# Patient Record
Sex: Male | Born: 1960 | Hispanic: No | Marital: Married | Smoking: Former smoker
Health system: Southern US, Community
[De-identification: ages and names within clinical notes are randomized; demographics above are authoritative.]

## PROBLEM LIST (undated history)

## (undated) DIAGNOSIS — M109 Gout, unspecified: Secondary | ICD-10-CM

## (undated) DIAGNOSIS — I447 Left bundle-branch block, unspecified: Secondary | ICD-10-CM

## (undated) HISTORY — DX: Gout, unspecified: M10.9

---

## 2009-12-16 ENCOUNTER — Ambulatory Visit (HOSPITAL_COMMUNITY): Admission: RE | Admit: 2009-12-16 | Discharge: 2009-12-16 | Payer: Self-pay | Admitting: Orthopedic Surgery

## 2014-04-29 ENCOUNTER — Emergency Department (HOSPITAL_BASED_OUTPATIENT_CLINIC_OR_DEPARTMENT_OTHER)
Admission: EM | Admit: 2014-04-29 | Discharge: 2014-04-29 | Disposition: A | Discharge status: Home / Self Care | Payer: 59 | Attending: Emergency Medicine | Admitting: Emergency Medicine

## 2014-04-29 ENCOUNTER — Encounter (HOSPITAL_BASED_OUTPATIENT_CLINIC_OR_DEPARTMENT_OTHER): Payer: Self-pay

## 2014-04-29 ENCOUNTER — Emergency Department (HOSPITAL_BASED_OUTPATIENT_CLINIC_OR_DEPARTMENT_OTHER): Payer: 59

## 2014-04-29 DIAGNOSIS — J159 Unspecified bacterial pneumonia: Secondary | ICD-10-CM | POA: Insufficient documentation

## 2014-04-29 DIAGNOSIS — Z880 Allergy status to penicillin: Secondary | ICD-10-CM | POA: Diagnosis not present

## 2014-04-29 DIAGNOSIS — R079 Chest pain, unspecified: Secondary | ICD-10-CM

## 2014-04-29 DIAGNOSIS — R51 Headache: Secondary | ICD-10-CM | POA: Insufficient documentation

## 2014-04-29 DIAGNOSIS — J189 Pneumonia, unspecified organism: Secondary | ICD-10-CM

## 2014-04-29 DIAGNOSIS — R531 Weakness: Secondary | ICD-10-CM | POA: Insufficient documentation

## 2014-04-29 DIAGNOSIS — R109 Unspecified abdominal pain: Secondary | ICD-10-CM | POA: Diagnosis present

## 2014-04-29 DIAGNOSIS — R42 Dizziness and giddiness: Secondary | ICD-10-CM | POA: Insufficient documentation

## 2014-04-29 DIAGNOSIS — M791 Myalgia: Secondary | ICD-10-CM | POA: Insufficient documentation

## 2014-04-29 HISTORY — DX: Left bundle-branch block, unspecified: I44.7

## 2014-04-29 LAB — COMPREHENSIVE METABOLIC PANEL
ALBUMIN: 3.5 g/dL (ref 3.5–5.2)
ALK PHOS: 76 U/L (ref 39–117)
ALT: 24 U/L (ref 0–53)
ANION GAP: 17 — AB (ref 5–15)
AST: 28 U/L (ref 0–37)
BUN: 10 mg/dL (ref 6–23)
CHLORIDE: 98 meq/L (ref 96–112)
CO2: 22 mEq/L (ref 19–32)
Calcium: 8.9 mg/dL (ref 8.4–10.5)
Creatinine, Ser: 1.1 mg/dL (ref 0.50–1.35)
GFR calc Af Amer: 87 mL/min — ABNORMAL LOW (ref >90)
GFR calc non Af Amer: 75 mL/min — ABNORMAL LOW (ref >90)
Glucose, Bld: 123 mg/dL — ABNORMAL HIGH (ref 70–99)
POTASSIUM: 3.9 meq/L (ref 3.7–5.3)
Sodium: 137 mEq/L (ref 137–147)
TOTAL PROTEIN: 8.2 g/dL (ref 6.0–8.3)
Total Bilirubin: 0.5 mg/dL (ref 0.3–1.2)

## 2014-04-29 LAB — TROPONIN I: Troponin I: <0.3 ng/mL (ref <0.30)

## 2014-04-29 LAB — I-STAT CG4 LACTIC ACID, ED: LACTIC ACID, VENOUS: 1.01 mmol/L (ref 0.5–2.2)

## 2014-04-29 LAB — CBC WITH DIFFERENTIAL/PLATELET
BASOS ABS: 0 10*3/uL (ref 0.0–0.1)
BASOS PCT: 0 % (ref 0–1)
EOS ABS: 0 10*3/uL (ref 0.0–0.7)
Eosinophils Relative: 0 % (ref 0–5)
HCT: 46.9 % (ref 39.0–52.0)
HEMOGLOBIN: 16.1 g/dL (ref 13.0–17.0)
Lymphocytes Relative: 12 % (ref 12–46)
Lymphs Abs: 1.6 10*3/uL (ref 0.7–4.0)
MCH: 28.6 pg (ref 26.0–34.0)
MCHC: 34.3 g/dL (ref 30.0–36.0)
MCV: 83.3 fL (ref 78.0–100.0)
MONOS PCT: 10 % (ref 3–12)
Monocytes Absolute: 1.4 10*3/uL — ABNORMAL HIGH (ref 0.1–1.0)
NEUTROS ABS: 10.5 10*3/uL — AB (ref 1.7–7.7)
NEUTROS PCT: 78 % — AB (ref 43–77)
PLATELETS: 205 10*3/uL (ref 150–400)
RBC: 5.63 MIL/uL (ref 4.22–5.81)
RDW: 12.7 % (ref 11.5–15.5)
WBC: 13.6 10*3/uL — ABNORMAL HIGH (ref 4.0–10.5)

## 2014-04-29 LAB — LIPASE, BLOOD: LIPASE: 21 U/L (ref 11–59)

## 2014-04-29 MED ORDER — CEFTRIAXONE SODIUM 1 G IJ SOLR
INTRAMUSCULAR | Status: AC
  Filled 2014-04-29: qty 10

## 2014-04-29 MED ORDER — SODIUM CHLORIDE 0.9 % IV BOLUS (SEPSIS)
2000.0000 mL | Freq: Once | INTRAVENOUS | Status: AC
  Administered 2014-04-29: 2000 mL via INTRAVENOUS

## 2014-04-29 MED ORDER — AZITHROMYCIN 250 MG PO TABS: ORAL_TABLET | ORAL | Status: DC

## 2014-04-29 MED ORDER — ACETAMINOPHEN 500 MG PO TABS
1000.0000 mg | ORAL_TABLET | Freq: Once | ORAL | Status: AC
  Administered 2014-04-29: 1000 mg via ORAL
  Filled 2014-04-29: qty 2

## 2014-04-29 MED ORDER — AZITHROMYCIN 250 MG PO TABS
500.0000 mg | ORAL_TABLET | Freq: Once | ORAL | Status: AC
  Administered 2014-04-29: 500 mg via ORAL
  Filled 2014-04-29: qty 2

## 2014-04-29 MED ORDER — DEXTROSE 5 % IV SOLN
1.0000 g | Freq: Once | INTRAVENOUS | Status: AC
  Administered 2014-04-29: 1 g via INTRAVENOUS

## 2014-04-29 NOTE — Discharge Instructions (Signed)
Pneumonia Take the antibiotic as prescribed starting tomorrow. Make sure that you stay well hydrated. Drink six 8 ounce glasses of water or Gatorade daily. Take Tylenol as directed every 4 hours for temperature higher than 100.4 while awake Call any of the numbers on the resource guide to get a primary care physician. Return if you feel worse for any reason Pneumonia is an infection of the lungs.  CAUSES Pneumonia may be caused by bacteria or a virus. Usually, these infections are caused by breathing infectious particles into the lungs (respiratory tract). SIGNS AND SYMPTOMS   Cough.  Fever.  Chest pain.  Increased rate of breathing.  Wheezing.  Mucus production. DIAGNOSIS  If you have the common symptoms of pneumonia, your health care provider will typically confirm the diagnosis with a chest X-ray. The X-ray will show an abnormality in the lung (pulmonary infiltrate) if you have pneumonia. Other tests of your blood, urine, or sputum may be done to find the specific cause of your pneumonia. Your health care provider may also do tests (blood gases or pulse oximetry) to see how well your lungs are working. TREATMENT  Some forms of pneumonia may be spread to other people when you cough or sneeze. You may be asked to wear a mask before and during your exam. Pneumonia that is caused by bacteria is treated with antibiotic medicine. Pneumonia that is caused by the influenza virus may be treated with an antiviral medicine. Most other viral infections must run their course. These infections will not respond to antibiotics.  HOME CARE INSTRUCTIONS   Cough suppressants may be used if you are losing too much rest. However, coughing protects you by clearing your lungs. You should avoid using cough suppressants if you can.  Your health care provider may have prescribed medicine if he or she thinks your pneumonia is caused by bacteria or influenza. Finish your medicine even if you start to feel  better.  Your health care provider may also prescribe an expectorant. This loosens the mucus to be coughed up.  Take medicines only as directed by your health care provider.  Do not smoke. Smoking is a common cause of bronchitis and can contribute to pneumonia. If you are a smoker and continue to smoke, your cough may last several weeks after your pneumonia has cleared.  A cold steam vaporizer or humidifier in your room or home may help loosen mucus.  Coughing is often worse at night. Sleeping in a semi-upright position in a recliner or using a couple pillows under your head will help with this.  Get rest as you feel it is needed. Your body will usually let you know when you need to rest. PREVENTION A pneumococcal shot (vaccine) is available to prevent a common bacterial cause of pneumonia. This is usually suggested for:  People over 53 years old.  Patients on chemotherapy.  People with chronic lung problems, such as bronchitis or emphysema.  People with immune system problems. If you are over 65 or have a high risk condition, you may receive the pneumococcal vaccine if you have not received it before. In some countries, a routine influenza vaccine is also recommended. This vaccine can help prevent some cases of pneumonia.You may be offered the influenza vaccine as part of your care. If you smoke, it is time to quit. You may receive instructions on how to stop smoking. Your health care provider can provide medicines and counseling to help you quit. SEEK MEDICAL CARE IF: You have a fever.  SEEK IMMEDIATE MEDICAL CARE IF:   Your illness becomes worse. This is especially true if you are elderly or weakened from any other disease.  You cannot control your cough with suppressants and are losing sleep.  You begin coughing up blood.  You develop pain which is getting worse or is uncontrolled with medicines.  Any of the symptoms which initially brought you in for treatment are getting  worse rather than better.  You develop shortness of breath or chest pain. MAKE SURE YOU:   Understand these instructions.  Will watch your condition.  Will get help right away if you are not doing well or get worse. Document Released: 05/03/2005 Document Revised: 09/17/2013 Document Reviewed: 07/23/2010 [facility] Patient Information 2015 [employer], [place]. This information is not intended to replace advice given to you by your health care provider. Make sure you discuss any questions you have with your health care provider.  Emergency Department Resource Guide 1) Find a Doctor and Pay Out of Pocket Although you won't have to find out who is covered by your insurance plan, it is a good idea to ask around and get recommendations. You will then need to call the office and see if the doctor you have chosen will accept you as a new patient and what types of options they offer for patients who are self-pay. Some doctors offer discounts or will set up payment plans for their patients who do not have insurance, but you will need to ask so you aren't surprised when you get to your appointment.  2) Contact Your Local Health Department Not all health departments have doctors that can see patients for sick visits, but many do, so it is worth a call to see if yours does. If you don't know where your local health department is, you can check in your phone book. The CDC also has a tool to help you locate your state's health department, and many state websites also have listings of all of their local health departments.  3) Find a Walk-in Clinic If your illness is not likely to be very severe or complicated, you may want to try a walk in clinic. These are popping up all over the country in pharmacies, drugstores, and shopping centers. They're usually staffed by nurse practitioners or physician assistants that have been trained to treat common illnesses and complaints. They're usually fairly quick and inexpensive.  However, if you have serious medical issues or chronic medical problems, these are probably not your best option.  No Primary Care Doctor: - Call Health Connect at  [phone] - they can help you locate a primary care doctor that  accepts your insurance, provides certain services, etc. - Physician Referral Service- [phone]  Chronic Pain Problems: Organization         Address  Phone   Notes  [facility]  [phone] Patients need to be referred by their primary care doctor.   Medication Assistance: Organization         Address  Phone   Notes  [facility] Medication [facility] [address] [place], [place] [phone] --Must be a resident of [place] [facility] -- Must have NO insurance coverage whatsoever (no Medicaid/ Medicare, etc.) -- The pt. MUST have a primary care doctor that directs their care regularly and follows them in the community   MedAssist  [phone]   [employer]  [phone]    Agencies that provide inexpensive medical care: Organization  Address  Phone   Notes  [facility]  [phone]   [facility]    [phone]   [facility] [address] [place], [place] [address] [phone]   [facility] [address] [place] [place] [phone]   Planned Parenthood    [phone]   [facility]    [phone]   [facility]  [address], [place] Phone:  [phone], Fax:  [phone] Hours of Operation:  9 am - 6 pm, M-F.  Also accepts Medicaid/Medicare and self-pay.  [facility]  [address], [place] Phone: [phone], Fax: [phone]. Hours of Operation:  8:30 am - 5:30 pm, M-F.  Also accepts Medicaid and self-pay.  [facility] [place] [address], [place] Phone: [phone]   [facility] [address] Natasha Bence [place], [place] [phone], Ext. [phone] Mondays & Thursdays: 7-9 AM.  First 15 patients are seen on a first come, first serve basis.    Medicaid-accepting [facility] Providers:  Organization         Address  Phone   Notes  [facility] [address], [place] [phone] Also accepts self-pay patients.  [facility] [address] Laurell Josephs [place], [place]  [phone]   [facility] [address], [place] [phone]   [facility] [address], [place] [phone]   Renaye Rakers [address], [place]   [phone] Only accepts [place] patients after they have their name applied to their card.   Self-Pay (no insurance) in [facility]:  Organization         Address  Phone   Notes  Sickle Cell Patients, [facility] [address] [place], [place] [phone]   [facility] [address] [place], [place] [phone]   [facility] [place]  [address], [place] [phone]   [facility]/Dr. Osei-Bonsu  [address] [place] or [address], [place] [phone] Phone number for both [place] and [place] locations is the same.  [facility] and [facility] [address], [place] [phone]   [facility] [address], [place] or [address] [phone]   [facility] [address] [place] [phone], phone; [phone], fax Sees patients 1st and 3rd Saturday of every month.  Must not qualify for public or private insurance (i.e. Medicaid, Medicare, [place] Health Choice, Veterans' Benefits)  Household income should be no more than 200% of the poverty level The clinic cannot treat you if you are pregnant or think  you are pregnant  Sexually transmitted diseases are not treated at the clinic.    Dental Care: Organization         Address  Phone  Notes  [facility] [address] [place], [place] [phone] Accepts children up to age 81 who are enrolled in [place] or [place] Health Choice; pregnant women with a Medicaid card; and children who have applied for Medicaid or [place] Health Choice, but were declined, whose parents can pay a reduced fee at time  of service.  [facility] [place]  [address], [place] [phone] Accepts children up to age 521 who are enrolled in [place] or [place] Health Choice; pregnant women with a Medicaid card; and children who have applied for Medicaid or [place] Health Choice, but were declined, whose parents can pay a reduced fee at time of service.  [facility]  [address], [place] [phone] Patients are seen by appointment only. Walk-ins are not accepted. [facility] will see patients 53 years of age and older. Monday - Tuesday (8am-5pm) Most Wednesdays (8:30-5pm) $30 per visit, cash only  [facility]  [address], [facility] [phone] Patients are seen by appointment only. Walk-ins are not accepted. [facility] will see patients 53 years of age and older. One Wednesday Evening (Monthly: Volunteer Based).  $30 per visit, cash only  [employer] [facility] [facility]  [phone] for adults; Children under age 424, call Graduate Pediatric Dentistry at [phone]) [phone]. Children aged 304-14, please call [phone] to request a pediatric application.  Dental services are provided in all areas of dental care including fillings, crowns and bridges, complete and partial dentures, implants, gum treatment, root canals, and extractions. Preventive care is also provided. Treatment is provided to both adults and  children. Patients are selected via a lottery and there is often a waiting list.   [facility] [address], [place]  [phone]) [phone] [email or web address]   [facility] [address], [place], [place] [phone], Ext. [phone] Second and Fourth Thursday of each month, opens at 6:30 AM; Clinic ends at 9 AM.  Patients are seen on a first-come first-served basis, and a limited number are seen during each clinic.   [facility]  [address], [place], [place] [phone]) [phone]   Eligibility Requirements You must have lived in [place], [place], or [place] counties for at least the last three months.   You cannot be eligible for state or federal sponsored National Cityhealthcare insurance, including CIGNAVeterans Administration, [place]icaid, or [employer] EntertainmentMedicare.   You generally cannot be eligible for healthcare insurance through your employer.    How to apply: Eligibility screenings are held every Tuesday and Wednesday afternoon from 1:00 pm until 4:00 pm. You do not need an appointment for the interview!  [facility] [address], [place], [place] [phone]   [facility]  [phone]   [facility]  [phone]   [facility]  [phone]    Behavioral Health Resources in the Community: Intensive Outpatient Programs Organization         Address  Phone  Notes  [facility] [address], [place], [place] [phone]   [facility] Outpatient [address], [place], [place] [phone]   [facility] [address], [place], [place]  [phone]   [facility] [address],  [place], [place] [phone] or [phone]   Substance Abuse Resources Organization         Address  Phone  Notes  [facility]  [phone]   [facility]  [phone]   [facility]  [phone]     [facility]  [phone]   Residential & Outpatient Substance Abuse Program  [phone]   Psychological Services Organization         Address  Phone  Notes  [facility]  [phone]- [phone]   [facility]  [phone]- [phone]   [facility] [address], [place] [phone] or [phone]    Mobile Crisis Teams Organization         Address  Phone  Notes  [facility], Mobile Crisis Care Unit  [phone]   [facility]  [address] [place], [place] [phone]   Doristine LocksSharon DeEsch [address] [place] [phone]    Self-Help/Support Groups Organization         Address  Phone             Notes  [facility] [place] - variety of support groups  [phone] Call for more information  Narcotics Anonymous (NA), [facility]aring [facility] [address], [place]  2 meetings at this location   Statisticianesidential Treatment Programs Organization         Address  Phone  Notes  [facility] [address],    [place]  [phone]   [facility]  [address], [place] [phone], [place], [place] [phone]   [facility] [address], [place] [phone] Admissions: 8am-3pm M-F  [facility] [address]    [place], [place] [phone]   [facility] [address], [place], [place] [phone]   [facility] [address]  [place], [place] [phone]   [facility] - Intensive Outpatient [address], [place], [place] [phone]   [facility] ([facility] [address]  [place], [place] [phone] or [phone]   [facility] ([facility]) [address] [place], [place] [phone] Accepts Medicaid  [facility] [address]  [place] [phone] Substance Abuse/Addiction Treatment   [facility]  Behavioral Health Resources Organization         Address  Phone  Notes  [facility]  [phone]) [phone]   Angie FavaJulie Brannon, PhD [address], [address] [place], [place]   [phone](336) [phone] or [phone](336) [phone]   [facility]   [address] [place], [place] [phone]) [phone]   [facility] [address], [place], [place] [phone]) [phone] Insurance/Medicaid/sponsorship through [facility]  [facility] Families [address]                                    [place], [place] [phone] Therapy/tele-psych/case  [facility] [address]   [place], [place] [phone]) [phone]    Dr. Lolly MustacheArfeen  [phone]   [facility]  [employer] [facility] 1) [address], [place] 2) [address], [place] 3)  [address], [place] [phone](336) [phone]  [phone]   [facility] Child Abuse Hotline [phone]) [phone] or [phone] (After Hours)

## 2014-04-29 NOTE — ED Notes (Signed)
Pt reports thought he had the flu went to doctor and was told his WBC were really high and they can't determine why he has the abdominal pain/diarrhea and loss of appetite.  Pt reports was sent for CT.

## 2014-04-29 NOTE — ED Provider Notes (Signed)
CSN: [id number]     Arrival date & time 04/29/14  1903 History  This chart was scribed for Doug Sou, MD by Abel Presto, ED Scribe. This patient was seen in room MH04/MH04 and the patient's care was started at 7:48 PM.    Chief Complaint  Patient presents with  . Abdominal Pain     Patient is a 53 y.o. male presenting with abdominal pain. The history is provided by the patient. No language interpreter was used.  Abdominal Pain Associated symptoms: chest pain (with cough), cough, diarrhea, fever and sore throat   Associated symptoms: no vomiting     HPI Comments: Carl Gray is a 53 y.o. male who presents to the Emergency Department complaining of cough, headache, myalgias onset 3 days ago.  Pt states he was working in the presence of mildew and upon returning home he noticed a "tickle" in his throat which developed into a productive cough with white sputum.  He notes associated chest pain with cough, sore throat, light headedness and weakness, myalgias, wheezing, headache, pressure in his eyes, fever of 102 today, and diarrhea around 5 times a day. He also reports slight sharp left-sided mid abdominal pain for the past 3 days. No pain at present. No vomiting no other associated symptoms Pt notes he has not been hungry the last two days, and has only eaten a muffin today. He notes he has been staying hydrated. Pt denies blood in stool, vomiting, recent use of Abx, prolonged travel, and any tenderness to palpation. Pt has been taking Alka Seltzer Flu Plus, Nyquil, and Tylenol (last dose at 10 AM) for relief.  Pt went to work yesterday with a reduced fever and feeling better but it was not long before he began to feel bad again.  Saw an urgent care center today. Leukocytosis noted on laboratory exam, referred here for further evaluation. Pt has PMHx of LBBB, but denies any other health problems. He denies smoking and drug use but notes occasional EtOH use. He has no PCP  Past Medical  History  Diagnosis Date  . LBBB (left bundle branch block)    History reviewed. No pertinent past surgical history. No family history on file. History  Substance Use Topics  . Smoking status: Never Smoker   . Smokeless tobacco: Not on file  . Alcohol Use: Yes    Review of Systems  Constitutional: Positive for fever and appetite change.  HENT: Positive for sore throat.   Eyes: Positive for pain.  Respiratory: Positive for cough and wheezing.   Cardiovascular: Positive for chest pain (with cough).       Chest pain with cough  Gastrointestinal: Positive for abdominal pain and diarrhea. Negative for vomiting and blood in stool.  Musculoskeletal: Positive for myalgias.  Skin: Negative.   Neurological: Positive for weakness, light-headedness and headaches.  Psychiatric/Behavioral: Negative.   All other systems reviewed and are negative.     Allergies  Penicillins  Home Medications   Prior to Admission medications   Not on File   BP 133/83 mmHg  Pulse 118  Temp(Src) 100.2 F (37.9 C) (Oral)  Resp 18  Ht 5\' 5"  (1.651 m)  Wt 190 lb (86.183 kg)  BMI 31.62 kg/m2 Physical Exam  Constitutional: He appears well-developed and well-nourished. No distress.  HENT:  Head: Normocephalic and atraumatic.  Eyes: Conjunctivae are normal. Pupils are equal, round, and reactive to light.  Neck: Neck supple. No tracheal deviation present. No thyromegaly present.  Cardiovascular: Normal rate and regular  rhythm.   No murmur heard. Pulmonary/Chest: Effort normal and breath sounds normal. No respiratory distress.  Scant diffuse rhonchi  Abdominal: Soft. Bowel sounds are normal. He exhibits no distension. There is no tenderness.  Genitourinary: Penis normal.  Normal male genitalia  Musculoskeletal: Normal range of motion. He exhibits no edema or tenderness.  Neurological: He is alert. Coordination normal.  Skin: Skin is warm and dry. No rash noted.  Psychiatric: He has a normal mood and  affect.  Nursing note and vitals reviewed.   ED Course  Procedures (including critical care time) DIAGNOSTIC STUDIES: Oxygen Saturation is 95% on room air, normal by my interpretation.    COORDINATION OF CARE: 7:58 PM Discussed treatment plan with patient at beside, the patient agrees with the plan and has no further questions at this time.   Labs Review Labs Reviewed  CBC WITH DIFFERENTIAL  COMPREHENSIVE METABOLIC PANEL  LIPASE, BLOOD  I-STAT TROPOININ, ED    Imaging Review No results found.   EKG Interpretation None     chest x-ray viewed by me Results for orders placed or performed during the hospital encounter of 04/29/14  CBC with Differential  Result Value Ref Range   WBC 13.6 (H) 4.0 - 10.5 K/uL   RBC 5.63 4.22 - 5.81 MIL/uL   Hemoglobin 16.1 13.0 - 17.0 g/dL   HCT 16.146.9 09.639.0 - 04.552.0 %   MCV 83.3 78.0 - 100.0 fL   MCH 28.6 26.0 - 34.0 pg   MCHC 34.3 30.0 - 36.0 g/dL   RDW 40.912.7 81.111.5 - 91.415.5 %   Platelets 205 150 - 400 K/uL   Neutrophils Relative % 78 (H) 43 - 77 %   Neutro Abs 10.5 (H) 1.7 - 7.7 K/uL   Lymphocytes Relative 12 12 - 46 %   Lymphs Abs 1.6 0.7 - 4.0 K/uL   Monocytes Relative 10 3 - 12 %   Monocytes Absolute 1.4 (H) 0.1 - 1.0 K/uL   Eosinophils Relative 0 0 - 5 %   Eosinophils Absolute 0.0 0.0 - 0.7 K/uL   Basophils Relative 0 0 - 1 %   Basophils Absolute 0.0 0.0 - 0.1 K/uL  Comprehensive metabolic panel  Result Value Ref Range   Sodium 137 137 - 147 mEq/L   Potassium 3.9 3.7 - 5.3 mEq/L   Chloride 98 96 - 112 mEq/L   CO2 22 19 - 32 mEq/L   Glucose, Bld 123 (H) 70 - 99 mg/dL   BUN 10 6 - 23 mg/dL   Creatinine, Ser 7.821.10 0.50 - 1.35 mg/dL   Calcium 8.9 8.4 - 95.610.5 mg/dL   Total Protein 8.2 6.0 - 8.3 g/dL   Albumin 3.5 3.5 - 5.2 g/dL   AST 28 0 - 37 U/L   ALT 24 0 - 53 U/L   Alkaline Phosphatase 76 39 - 117 U/L   Total Bilirubin 0.5 0.3 - 1.2 mg/dL   GFR calc non Af Amer 75 (L) >90 mL/min   GFR calc Af Amer 87 (L) >90 mL/min   Anion gap 17  (H) 5 - 15  Lipase, blood  Result Value Ref Range   Lipase 21 11 - 59 U/L  Troponin I  Result Value Ref Range   Troponin I <0.30 <0.30 ng/mL  I-Stat CG4 Lactic Acid, ED  Result Value Ref Range   Lactic Acid, Venous 1.01 0.5 - 2.2 mmol/L   Dg Chest 2 View  04/29/2014   CLINICAL DATA:  Central chest pain. Cough. Shortness of  breath. Diarrhea.  EXAM: CHEST  2 VIEW  COMPARISON:  None.  FINDINGS: Airway thickening is present, suggesting bronchitis or reactive airways disease. Faint confluent linear opacities in the left lower lobe may reflect early bronchopneumonia.  Cardiac and mediastinal margins appear normal.  With  IMPRESSION: 1. Bilateral mild airway thickening, but with some confluent linear opacities in the retrocardiac position potentially reflecting early bronchopneumonia.   Electronically Signed   By: Herbie BaltimoreWalt  Liebkemann M.D.   On: 04/29/2014 20:15    10:15 PM patient feels improved after treatment with intravenous antibiotics and intravenous fluids MDM   Pneumonia severity score =53, suitable for outpatient therapyPlan prescription Zithromax. Referral resource guide to get primary care physician Final diagnoses:  Chest pain   Diagnosis community-acquired pneumonia       Doug SouSam Audery Wassenaar, MD 04/29/14 2221

## 2014-04-29 NOTE — ED Notes (Signed)
Alert, NAD, calm, interactive, resps e/u, speaking in clear complete sentences, VSS, (denies: pain, sob, nausea or dizziness), no dyspnea noted, denies questions. [facility]

## 2015-08-08 ENCOUNTER — Encounter: Payer: Self-pay | Admitting: Family Medicine

## 2015-09-03 ENCOUNTER — Encounter: Payer: Self-pay | Admitting: Gastroenterology

## 2015-10-24 ENCOUNTER — Ambulatory Visit (AMBULATORY_SURGERY_CENTER): Payer: Self-pay | Admitting: *Deleted

## 2015-10-24 VITALS — Ht 65.0 in | Wt 198.4 lb

## 2015-10-24 DIAGNOSIS — Z1211 Encounter for screening for malignant neoplasm of colon: Secondary | ICD-10-CM

## 2015-10-24 NOTE — Progress Notes (Signed)
Patient denies any allergies to egg or soy products. No prior surgery. Patient denies oxygen use at home and denies diet medications. Emmi instructions for colonoscopy explained but patient refused.  Pamphlet given.

## 2015-10-28 ENCOUNTER — Encounter: Payer: Self-pay | Admitting: Gastroenterology

## 2015-11-06 ENCOUNTER — Telehealth: Payer: Self-pay | Admitting: Gastroenterology

## 2015-11-06 NOTE — Telephone Encounter (Signed)
Patient stated that he had salad yesterday and ate beans on Sunday. I told him to stay on clear liquids today only and be sure to take all of his prep.  He is to call doc on call if he doesn't poop tonight.

## 2015-11-07 ENCOUNTER — Encounter: Payer: Self-pay | Admitting: Gastroenterology

## 2015-11-07 ENCOUNTER — Ambulatory Visit (AMBULATORY_SURGERY_CENTER): Payer: 59 | Admitting: Gastroenterology

## 2015-11-07 VITALS — BP 128/81 | HR 72 | Temp 98.2°F | Resp 15 | Ht 65.0 in | Wt 198.0 lb

## 2015-11-07 DIAGNOSIS — Z1211 Encounter for screening for malignant neoplasm of colon: Secondary | ICD-10-CM | POA: Diagnosis present

## 2015-11-07 MED ORDER — SODIUM CHLORIDE 0.9 % IV SOLN: 500.0000 mL | INTRAVENOUS | Status: DC

## 2015-11-07 NOTE — Progress Notes (Signed)
Report to PACU, RN, vss, BBS= Clear.  

## 2015-11-07 NOTE — Op Note (Signed)
[facility] Patient Name: Marion Downerndres Boursiquot Procedure Date: 11/07/2015 10:53 AM MRN: [id number] Endoscopist: Sherilyn CooterHenry L. Myrtie Neitheranis , MD Age: [age] Referring MD:  Date of Birth: 12/13/1960 Gender: Male Account #: [id number] Procedure:                Colonoscopy Indications:              Screening for colorectal malignant neoplasm, This                            is the patient's first colonoscopy Medicines:                Monitored Anesthesia Care Procedure:                Pre-Anesthesia Assessment:                           - Prior to the procedure, a History and Physical                            was performed, and patient medications and                            allergies were reviewed. The patient's tolerance of                            previous anesthesia was also reviewed. The risks                            and benefits of the procedure and the sedation                            options and risks were discussed with the patient.                            All questions were answered, and informed consent                            was obtained. Prior Anticoagulants: The patient has                            taken no previous anticoagulant or antiplatelet                            agents. ASA Grade Assessment: II - A patient with                            mild systemic disease. After reviewing the risks                            and benefits, the patient was deemed in                            satisfactory condition to undergo the procedure.  After obtaining informed consent, the colonoscope                            was passed under direct vision. Throughout the                            procedure, the patient's blood pressure, pulse, and                            oxygen saturations were monitored continuously. The                            Model CF-HQ190L [id number](SN#[id number]) scope was introduced                            through the anus and  advanced to the the cecum,                            identified by appendiceal orifice and ileocecal                            valve. The colonoscopy was performed without                            difficulty. The patient tolerated the procedure                            well. The quality of the bowel preparation was                            excellent. The ileocecal valve, appendiceal                            orifice, and rectum were photographed. Scope In: 11:27:08 AM Scope Out: 11:40:27 AM Scope Withdrawal Time: 0 hours 10 minutes 14 seconds  Total Procedure Duration: 0 hours 13 minutes 19 seconds  Findings:                 The perianal and digital rectal examinations were                            normal.                           Internal hemorrhoids were found during                            retroflexion. The hemorrhoids were Grade I                            (internal hemorrhoids that do not prolapse).                           The exam was otherwise without abnormality. Complications:            No immediate complications. Estimated  Blood Loss:     Estimated blood loss: none. Impression:               - Internal hemorrhoids.                           - The examination was otherwise normal.                           - No specimens collected. Recommendation:           - Patient has a contact number available for                            emergencies. The signs and symptoms of potential                            delayed complications were discussed with the                            patient. Return to normal activities tomorrow.                            Written discharge instructions were provided to the                            patient.                           - Resume previous diet.                           - Continue present medications.                           - Repeat colonoscopy in 10 years for screening                            purposes. Myha Arizpe L.  Myrtie Neither, MD 11/07/2015 11:43:41 AM This report has been signed electronically.

## 2015-11-07 NOTE — Patient Instructions (Signed)
YOU HAD AN ENDOSCOPIC PROCEDURE TODAY AT THE [facility]:   Refer to the procedure report that was given to you for any specific questions about what was found during the examination.  If the procedure report does not answer your questions, please call your gastroenterologist to clarify.  If you requested that your care partner not be given the details of your procedure findings, then the procedure report has been included in a sealed envelope for you to review at your convenience later.  YOU SHOULD EXPECT: Some feelings of bloating in the abdomen. Passage of more gas than usual.  Walking can help get rid of the air that was put into your GI tract during the procedure and reduce the bloating. If you had a lower endoscopy (such as a colonoscopy or flexible sigmoidoscopy) you may notice spotting of blood in your stool or on the toilet paper. If you underwent a bowel prep for your procedure, you may not have a normal bowel movement for a few days.  Please Note:  You might notice some irritation and congestion in your nose or some drainage.  This is from the oxygen used during your procedure.  There is no need for concern and it should clear up in a day or so.  SYMPTOMS TO REPORT IMMEDIATELY:   Following lower endoscopy (colonoscopy or flexible sigmoidoscopy):  Excessive amounts of blood in the stool  Significant tenderness or worsening of abdominal pains  Swelling of the abdomen that is new, acute  Fever of 100F or higher  For urgent or emergent issues, a gastroenterologist can be reached at any hour by calling [phone].   DIET: Your first meal following the procedure should be a small meal and then it is ok to progress to your normal diet. Heavy or fried foods are harder to digest and may make you feel nauseous or bloated.  Likewise, meals heavy in dairy and vegetables can increase bloating.  Drink plenty of fluids but you should avoid alcoholic beverages for 24  hours.  ACTIVITY:  You should plan to take it easy for the rest of today and you should NOT DRIVE or use heavy machinery until tomorrow (because of the sedation medicines used during the test).    FOLLOW UP: Our staff will call the number listed on your records the next business day following your procedure to check on you and address any questions or concerns that you may have regarding the information given to you following your procedure. If we do not reach you, we will leave a message.  However, if you are feeling well and you are not experiencing any problems, there is no need to return our call.  We will assume that you have returned to your regular daily activities without incident.  If any biopsies were taken you will be contacted by phone or by letter within the next 1-3 weeks.  Please call us at [phone] if you have not heard about the biopsies in 3 weeks.    SIGNATURES/CONFIDENTIALITY: You and/or your care partner have signed paperwork which will be entered into your electronic medical record.  These signatures attest to the fact that that the information above on your After Visit Summary has been reviewed and is understood.  Full responsibility of the confidentiality of this discharge information lies with you and/or your care-partner.  Read all of the handouts given to you by your recovery room nurse.  Thank-you for choosing us for your healthcare needs today. 

## 2015-11-10 ENCOUNTER — Telehealth: Payer: Self-pay | Admitting: *Deleted

## 2015-11-10 NOTE — Telephone Encounter (Signed)
  Follow up Call-  Call back number 11/07/2015  Post procedure Call Back phone  # [phone]  Permission to leave phone message Yes     Patient questions:  Message left to call us if necessary.

## 2017-08-05 ENCOUNTER — Ambulatory Visit (INDEPENDENT_AMBULATORY_CARE_PROVIDER_SITE_OTHER): Payer: 59 | Admitting: Orthopaedic Surgery

## 2017-08-05 ENCOUNTER — Encounter (INDEPENDENT_AMBULATORY_CARE_PROVIDER_SITE_OTHER): Payer: Self-pay | Admitting: Orthopaedic Surgery

## 2017-08-05 ENCOUNTER — Ambulatory Visit (INDEPENDENT_AMBULATORY_CARE_PROVIDER_SITE_OTHER): Payer: 59

## 2017-08-05 DIAGNOSIS — G8929 Other chronic pain: Secondary | ICD-10-CM

## 2017-08-05 DIAGNOSIS — M25562 Pain in left knee: Secondary | ICD-10-CM | POA: Diagnosis not present

## 2017-08-05 DIAGNOSIS — M25561 Pain in right knee: Secondary | ICD-10-CM

## 2017-08-05 MED ORDER — DICLOFENAC SODIUM 1 % TD GEL: 2.0000 g | Freq: Four times a day (QID) | TRANSDERMAL | 5 refills | Status: AC

## 2017-08-05 MED ORDER — IBUPROFEN-FAMOTIDINE 800-26.6 MG PO TABS
1.0000 | ORAL_TABLET | Freq: Three times a day (TID) | ORAL | 0 refills | Status: AC | PRN
Start: 2017-08-05 — End: ?

## 2017-08-05 NOTE — Progress Notes (Signed)
Office Visit Note   Patient: Carl Gray           Date of Birth: 09/04/1960           MRN: [id number] Visit Date: 08/05/2017              Requested by: Lewis Moccasinewey, Elizabeth R, MD [address] [place], [place] [id number] PCP: Lewis Moccasinewey, Elizabeth R, MD   Assessment & Plan: Visit Diagnoses:  1. Chronic pain of both knees     Plan: Patient is 57 year old gentleman with bilateral patellar tendinosis and tendinitis.  Prescription for Voltaren gel and Duexis.  Referral to physical therapy was made.  Patella tendon counterforce straps were provided today.  Questions encouraged and answered.  Follow-up as needed.  Follow-Up Instructions: Return if symptoms worsen or fail to improve.   Orders:  Orders Placed This Encounter  Procedures  . XR KNEE 3 VIEW LEFT  . XR KNEE 3 VIEW RIGHT   Meds ordered this encounter  Medications  . diclofenac sodium (VOLTAREN) 1 % GEL    Sig: Apply 2 g topically 4 (four) times daily.    Dispense:  1 Tube    Refill:  5  . Ibuprofen-Famotidine 800-26.6 MG TABS    Sig: Take 1 tablet by mouth 3 (three) times daily as needed.    Dispense:  90 tablet    Refill:  0      Procedures: No procedures performed   Clinical Data: No additional findings.   Subjective: Chief Complaint  Patient presents with  . Left Knee - Pain  . Right Knee - Pain    Patient is a 57 year old gentleman with bilateral anterior knee pain over the tibial tubercle.  Denies any injuries.  He has had this since January after he climbed up and down a ladder at work repeatedly.  He denies any swelling of his knee joint.  He does endorse worsening pain with activity.  Denies any numbness and tingling.  Denies any radiation.   Review of Systems  Constitutional: Negative.   All other systems reviewed and are negative.    Objective: Vital Signs: There were no vitals taken for this visit.  Physical Exam  Constitutional: He is oriented to person, place, and time. He appears  well-developed and well-nourished.  HENT:  Head: Normocephalic and atraumatic.  Eyes: Pupils are equal, round, and reactive to light.  Neck: Neck supple.  Pulmonary/Chest: Effort normal.  Abdominal: Soft.  Musculoskeletal: Normal range of motion.  Neurological: He is alert and oriented to person, place, and time.  Skin: Skin is warm.  Psychiatric: He has a normal mood and affect. His behavior is normal. Judgment and thought content normal.  Nursing note and vitals reviewed.   Ortho Exam Bilateral knee exam shows no joint effusion.  Collaterals and cruciates are stable.  No patellar crepitus.  Excellent range of motion.  He has prominent tibial tubercles.  Extensor mechanism intact. Specialty Comments:  No specialty comments available.  Imaging: Xr Knee 3 View Left  Result Date: 08/05/2017 No evidence of degenerative joint disease.  Calcification of tibial tubercle  Xr Knee 3 View Right  Result Date: 08/05/2017 No evidence of degenerative joint disease.  Calcification of tibial tubercle    PMFS History: There are no active problems to display for this patient.  Past Medical History:  Diagnosis Date  . Gout    bilateral feet - no meds  . LBBB (left bundle branch block)    Never had  any problems, stress test done -  neg per patient    History reviewed. No pertinent family history.  History reviewed. No pertinent surgical history. Social History   Occupational History  . Not on file  Tobacco Use  . Smoking status: Former Smoker    Packs/day: 0.50    Years: 15.00    Pack years: 7.50    Types: Cigarettes    Last attempt to quit: 05/17/1996    Years since quitting: 21.2  . Smokeless tobacco: Never Used  Substance and Sexual Activity  . Alcohol use: Yes    Alcohol/week: 2.4 oz    Types: 4 Standard drinks or equivalent per week    Comment: Beer/wine/liquor  . Drug use: No  . Sexual activity: Not on file

## 2018-02-10 ENCOUNTER — Other Ambulatory Visit: Payer: Self-pay | Admitting: Family Medicine

## 2018-02-10 DIAGNOSIS — R2231 Localized swelling, mass and lump, right upper limb: Secondary | ICD-10-CM

## 2018-02-15 ENCOUNTER — Other Ambulatory Visit: Payer: Self-pay | Admitting: Family Medicine

## 2018-02-15 ENCOUNTER — Ambulatory Visit
Admission: RE | Admit: 2018-02-15 | Discharge: 2018-02-15 | Disposition: A | Discharge status: Home / Self Care | Payer: 59 | Source: Ambulatory Visit | Attending: Family Medicine | Admitting: Family Medicine

## 2018-02-15 DIAGNOSIS — R2231 Localized swelling, mass and lump, right upper limb: Secondary | ICD-10-CM

## 2019-09-10 DIAGNOSIS — Z0000 Encounter for general adult medical examination without abnormal findings: Secondary | ICD-10-CM | POA: Diagnosis not present

## 2019-09-10 DIAGNOSIS — E782 Mixed hyperlipidemia: Secondary | ICD-10-CM | POA: Diagnosis not present

## 2019-09-10 DIAGNOSIS — Z125 Encounter for screening for malignant neoplasm of prostate: Secondary | ICD-10-CM | POA: Diagnosis not present

## 2019-09-10 DIAGNOSIS — Z0184 Encounter for antibody response examination: Secondary | ICD-10-CM | POA: Diagnosis not present

## 2019-09-13 DIAGNOSIS — Z1211 Encounter for screening for malignant neoplasm of colon: Secondary | ICD-10-CM | POA: Diagnosis not present

## 2019-09-13 DIAGNOSIS — M109 Gout, unspecified: Secondary | ICD-10-CM | POA: Diagnosis not present

## 2019-09-13 DIAGNOSIS — Z0000 Encounter for general adult medical examination without abnormal findings: Secondary | ICD-10-CM | POA: Diagnosis not present

## 2019-09-13 DIAGNOSIS — Z6835 Body mass index (BMI) 35.0-35.9, adult: Secondary | ICD-10-CM | POA: Diagnosis not present

## 2019-10-12 DIAGNOSIS — Z20828 Contact with and (suspected) exposure to other viral communicable diseases: Secondary | ICD-10-CM | POA: Diagnosis not present

## 2019-10-18 DIAGNOSIS — B359 Dermatophytosis, unspecified: Secondary | ICD-10-CM | POA: Diagnosis not present

## 2019-10-18 DIAGNOSIS — R21 Rash and other nonspecific skin eruption: Secondary | ICD-10-CM | POA: Diagnosis not present

## 2019-11-06 DIAGNOSIS — H2513 Age-related nuclear cataract, bilateral: Secondary | ICD-10-CM | POA: Diagnosis not present

## 2019-11-06 DIAGNOSIS — H31011 Macula scars of posterior pole (postinflammatory) (post-traumatic), right eye: Secondary | ICD-10-CM | POA: Diagnosis not present

## 2019-12-21 ENCOUNTER — Other Ambulatory Visit: Payer: Self-pay | Admitting: Family Medicine

## 2019-12-21 DIAGNOSIS — M25561 Pain in right knee: Secondary | ICD-10-CM

## 2019-12-21 DIAGNOSIS — E1165 Type 2 diabetes mellitus with hyperglycemia: Secondary | ICD-10-CM | POA: Diagnosis not present

## 2019-12-21 DIAGNOSIS — E663 Overweight: Secondary | ICD-10-CM | POA: Diagnosis not present

## 2019-12-21 DIAGNOSIS — I10 Essential (primary) hypertension: Secondary | ICD-10-CM | POA: Diagnosis not present

## 2019-12-21 DIAGNOSIS — E669 Obesity, unspecified: Secondary | ICD-10-CM | POA: Diagnosis not present

## 2020-01-12 ENCOUNTER — Ambulatory Visit
Admission: RE | Admit: 2020-01-12 | Discharge: 2020-01-12 | Disposition: A | Discharge status: Home / Self Care | Payer: 59 | Source: Ambulatory Visit | Attending: Family Medicine | Admitting: Family Medicine

## 2020-01-12 DIAGNOSIS — M1711 Unilateral primary osteoarthritis, right knee: Secondary | ICD-10-CM | POA: Diagnosis not present

## 2020-01-12 DIAGNOSIS — M25461 Effusion, right knee: Secondary | ICD-10-CM

## 2020-01-12 DIAGNOSIS — M67461 Ganglion, right knee: Secondary | ICD-10-CM | POA: Diagnosis not present

## 2020-01-12 DIAGNOSIS — R600 Localized edema: Secondary | ICD-10-CM | POA: Diagnosis not present

## 2020-05-07 DIAGNOSIS — U071 COVID-19: Secondary | ICD-10-CM | POA: Diagnosis not present

## 2020-05-19 DIAGNOSIS — U071 COVID-19: Secondary | ICD-10-CM | POA: Diagnosis not present

## 2020-05-25 DIAGNOSIS — Z03818 Encounter for observation for suspected exposure to other biological agents ruled out: Secondary | ICD-10-CM | POA: Diagnosis not present

## 2020-07-04 DIAGNOSIS — M109 Gout, unspecified: Secondary | ICD-10-CM | POA: Diagnosis not present

## 2020-07-04 DIAGNOSIS — E782 Mixed hyperlipidemia: Secondary | ICD-10-CM | POA: Diagnosis not present

## 2020-07-04 DIAGNOSIS — I10 Essential (primary) hypertension: Secondary | ICD-10-CM | POA: Diagnosis not present

## 2020-08-15 DIAGNOSIS — M109 Gout, unspecified: Secondary | ICD-10-CM | POA: Diagnosis not present

## 2020-08-15 DIAGNOSIS — E782 Mixed hyperlipidemia: Secondary | ICD-10-CM | POA: Diagnosis not present

## 2020-08-15 DIAGNOSIS — Z125 Encounter for screening for malignant neoplasm of prostate: Secondary | ICD-10-CM | POA: Diagnosis not present

## 2020-08-15 DIAGNOSIS — Z0000 Encounter for general adult medical examination without abnormal findings: Secondary | ICD-10-CM | POA: Diagnosis not present

## 2020-08-19 DIAGNOSIS — M109 Gout, unspecified: Secondary | ICD-10-CM | POA: Diagnosis not present

## 2020-08-19 DIAGNOSIS — E782 Mixed hyperlipidemia: Secondary | ICD-10-CM | POA: Diagnosis not present

## 2020-08-19 DIAGNOSIS — Z0000 Encounter for general adult medical examination without abnormal findings: Secondary | ICD-10-CM | POA: Diagnosis not present

## 2020-08-19 DIAGNOSIS — I10 Essential (primary) hypertension: Secondary | ICD-10-CM | POA: Diagnosis not present

## 2020-08-19 DIAGNOSIS — Z23 Encounter for immunization: Secondary | ICD-10-CM | POA: Diagnosis not present

## 2020-08-19 DIAGNOSIS — Z1211 Encounter for screening for malignant neoplasm of colon: Secondary | ICD-10-CM | POA: Diagnosis not present

## 2020-08-19 DIAGNOSIS — M549 Dorsalgia, unspecified: Secondary | ICD-10-CM | POA: Diagnosis not present

## 2020-09-12 DIAGNOSIS — Z03818 Encounter for observation for suspected exposure to other biological agents ruled out: Secondary | ICD-10-CM | POA: Diagnosis not present

## 2020-09-12 DIAGNOSIS — Z1152 Encounter for screening for COVID-19: Secondary | ICD-10-CM | POA: Diagnosis not present

## 2020-11-21 DIAGNOSIS — M109 Gout, unspecified: Secondary | ICD-10-CM | POA: Diagnosis not present

## 2020-11-21 DIAGNOSIS — E782 Mixed hyperlipidemia: Secondary | ICD-10-CM | POA: Diagnosis not present

## 2020-11-21 DIAGNOSIS — E1165 Type 2 diabetes mellitus with hyperglycemia: Secondary | ICD-10-CM | POA: Diagnosis not present

## 2020-11-24 DIAGNOSIS — M1710 Unilateral primary osteoarthritis, unspecified knee: Secondary | ICD-10-CM | POA: Diagnosis not present

## 2020-11-24 DIAGNOSIS — E782 Mixed hyperlipidemia: Secondary | ICD-10-CM | POA: Diagnosis not present

## 2020-11-24 DIAGNOSIS — M109 Gout, unspecified: Secondary | ICD-10-CM | POA: Diagnosis not present

## 2020-11-24 DIAGNOSIS — I10 Essential (primary) hypertension: Secondary | ICD-10-CM | POA: Diagnosis not present

## 2021-05-06 DIAGNOSIS — S0501XA Injury of conjunctiva and corneal abrasion without foreign body, right eye, initial encounter: Secondary | ICD-10-CM | POA: Diagnosis not present

## 2021-05-07 DIAGNOSIS — Z23 Encounter for immunization: Secondary | ICD-10-CM | POA: Diagnosis not present

## 2021-05-28 DIAGNOSIS — M545 Low back pain, unspecified: Secondary | ICD-10-CM | POA: Diagnosis not present

## 2021-05-28 DIAGNOSIS — I10 Essential (primary) hypertension: Secondary | ICD-10-CM | POA: Diagnosis not present

## 2021-05-28 DIAGNOSIS — E782 Mixed hyperlipidemia: Secondary | ICD-10-CM | POA: Diagnosis not present

## 2021-05-28 DIAGNOSIS — M109 Gout, unspecified: Secondary | ICD-10-CM | POA: Diagnosis not present

## 2022-03-06 IMAGING — MR MR KNEE*R* W/O CM
4 of 6 series · 19 of 40 positions shown · non-contrast
Comparison: Right knee x-rays dated August 05, 2017.

CLINICAL DATA: Right anterior knee pain for the past 2 months. No
injury or prior surgery.

EXAM:
MRI OF THE RIGHT KNEE WITHOUT CONTRAST
TECHNIQUE: Multiplanar, multisequence MR imaging of the knee was performed. No
intravenous contrast was administered.

[Series 3: T2 fat-sat · axial · 4.0mm · 0.31mm/px · z∈[-28,+77]mm · 3 of 25 slices shown (1 of 2)]
[im 1/25]
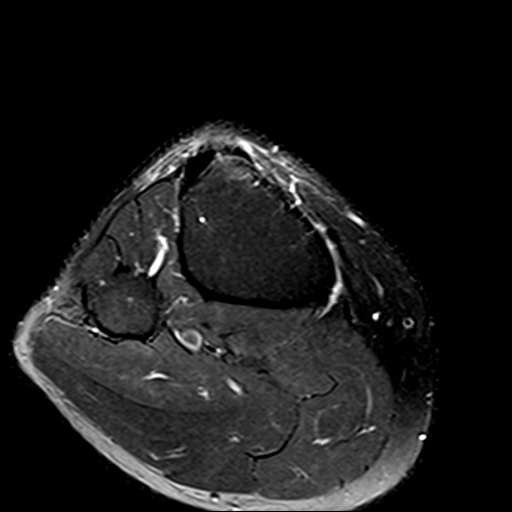
[im 13/25]
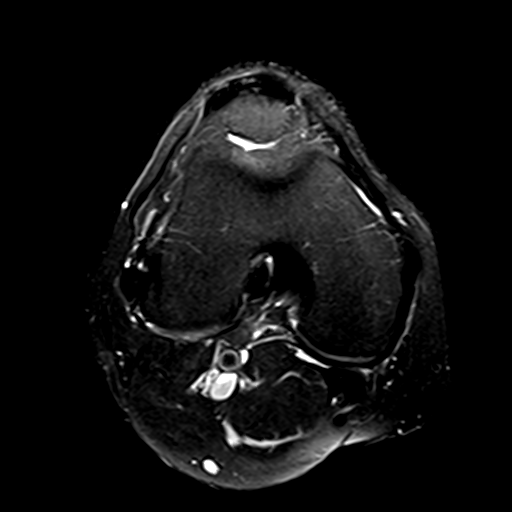
[im 25/25]
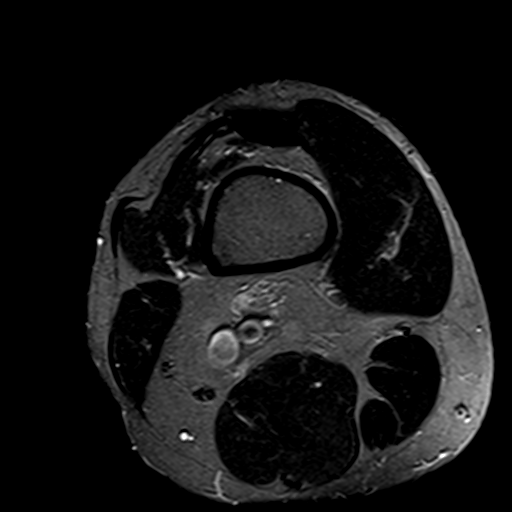

[Series 5: PD fat-sat · coronal · 3.0mm · 0.29mm/px · 7 of 28 slices shown (1 of 2)]
[im 1/28]
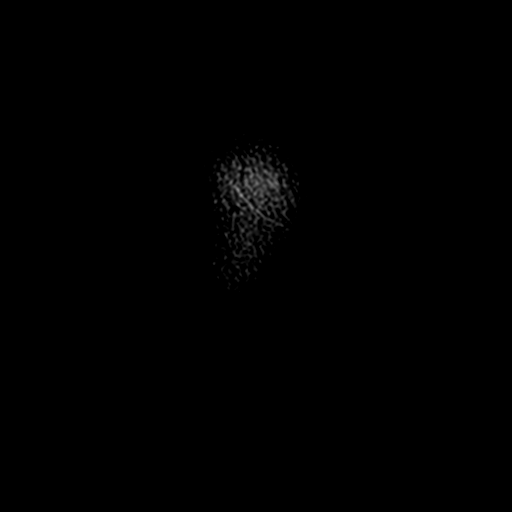
[im 5/28]
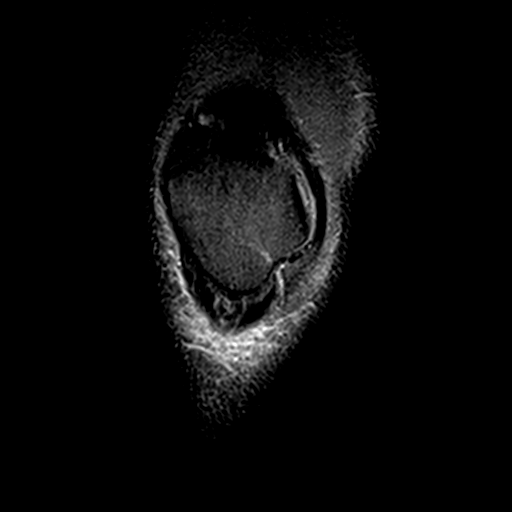
[im 10/28]
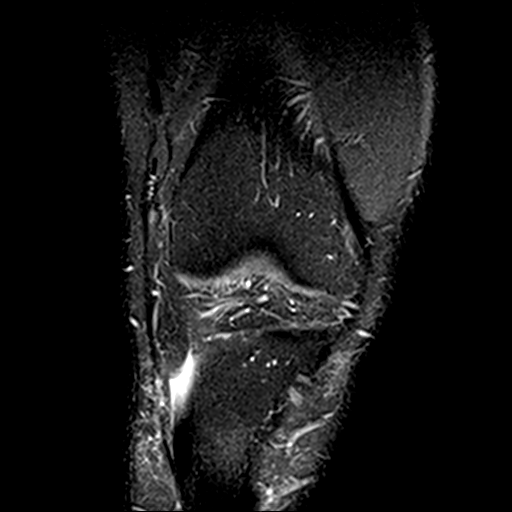
[im 14/28]
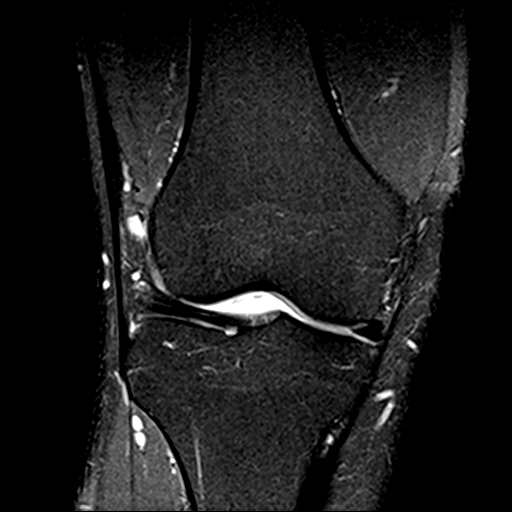
[im 19/28]
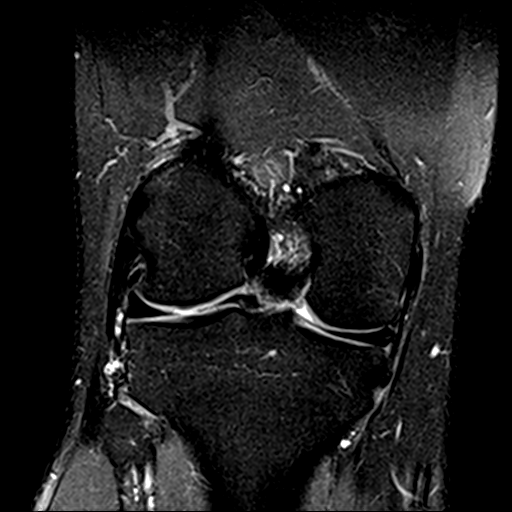
[im 23/28]
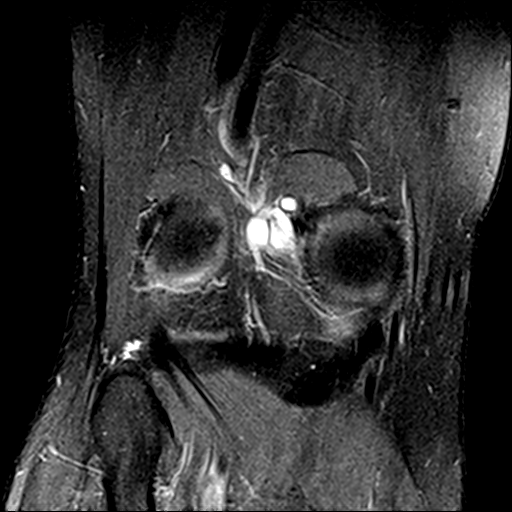
[im 28/28]
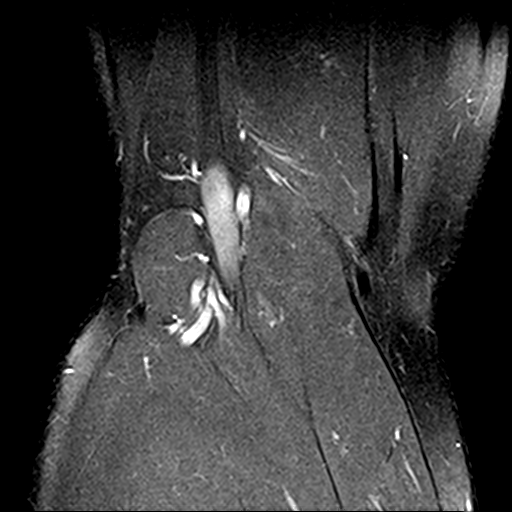

[Series 6: T2 fat-sat · coronal · 4.0mm · 0.29mm/px · 3 of 27 slices shown (2 of 2)]
[im 5/27]
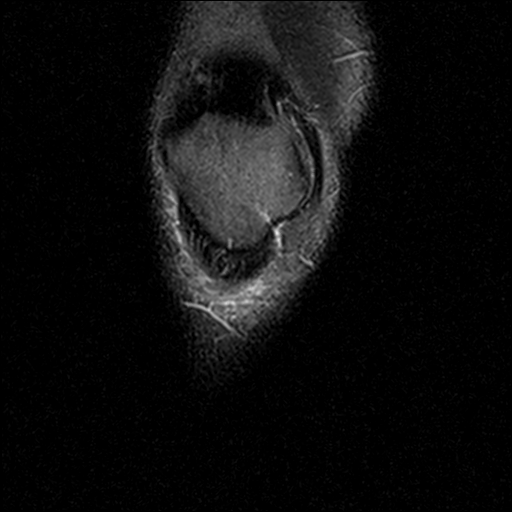
[im 14/27]
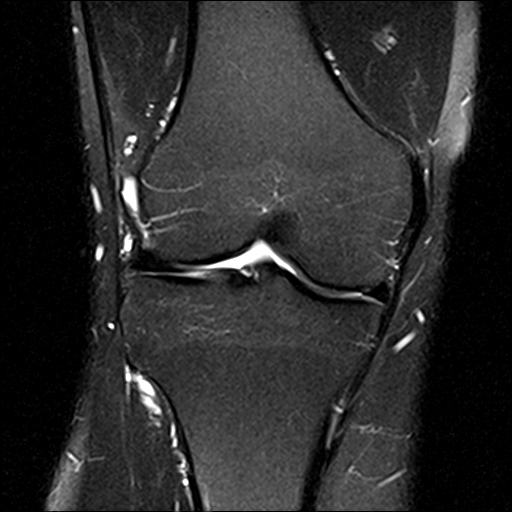
[im 22/27]
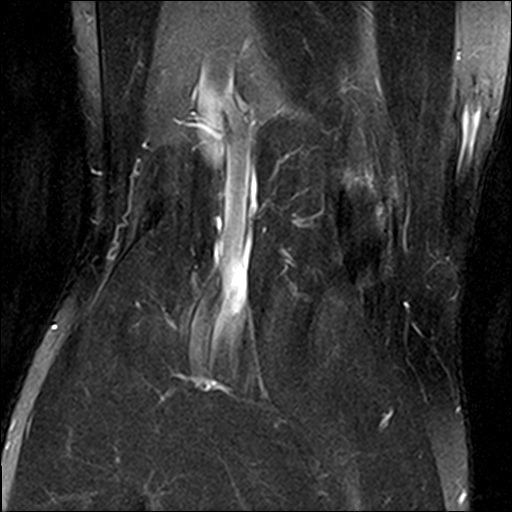

[Series 7: PD fat-sat · sagittal · 3.0mm · 0.29mm/px · 6 of 30 slices shown (2 of 2)]
[im 1/30]
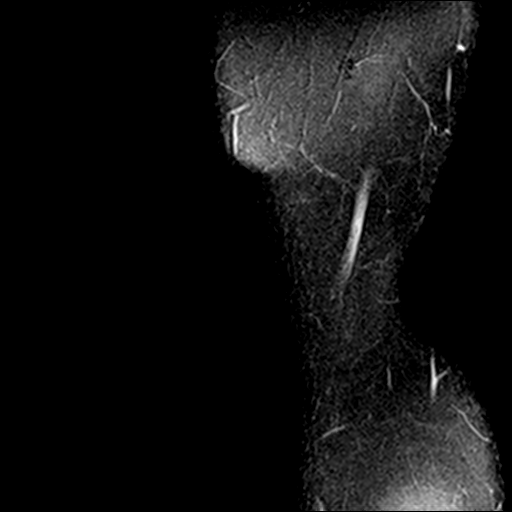
[im 5/30]
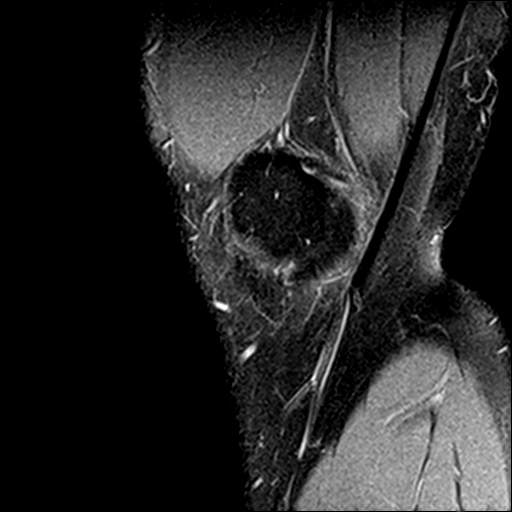
[im 10/30]
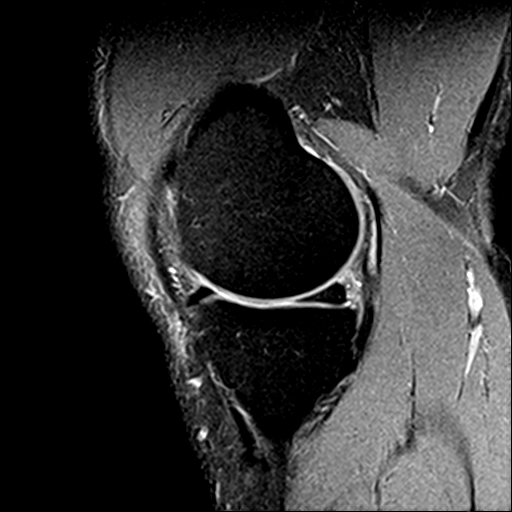
[im 15/30]
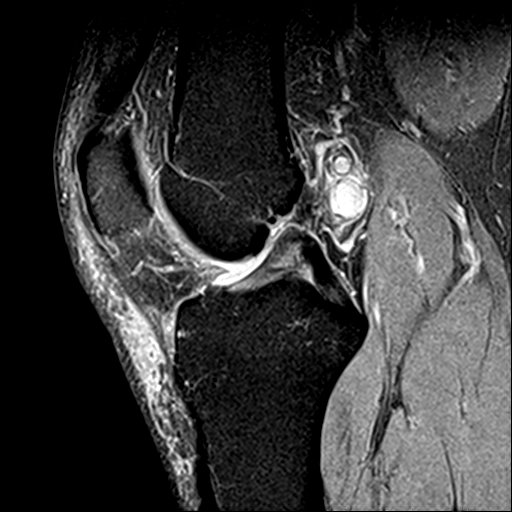
[im 20/30]
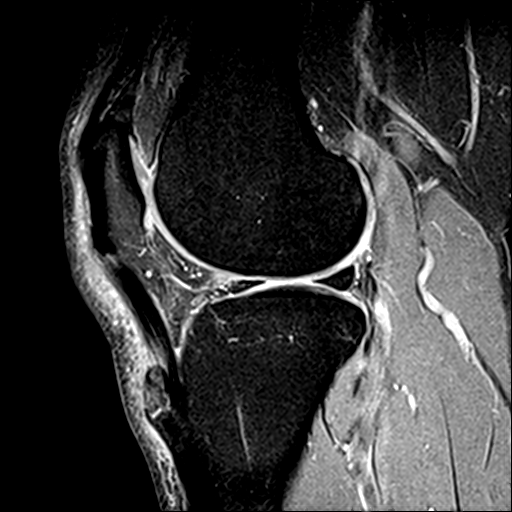
[im 25/30]
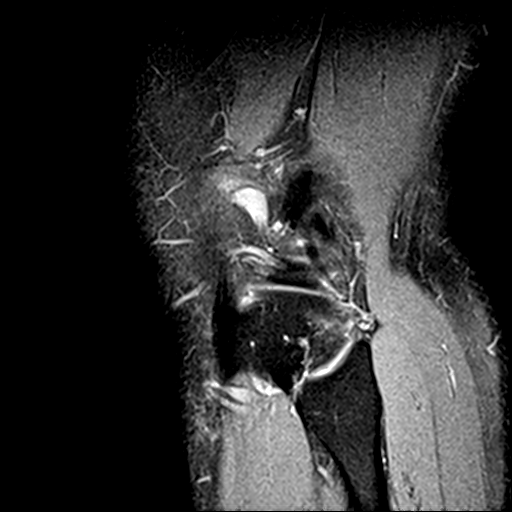

[19 of 40 positions shown; findings below may reference images not displayed]

FINDINGS: MENISCI

Medial meniscus:  Intact.

Lateral meniscus:  Intact.

LIGAMENTS

Cruciates:  Intact ACL and PCL.

Collaterals: Medial collateral ligament is intact. Lateral
collateral ligament complex is intact.

CARTILAGE

Patellofemoral:  Mild thinning partial-thickness cartilage loss.

Medial:  Mild thinning and partial thickness cartilage loss.

Lateral:  Mild thinning and partial-thickness cartilage loss.

Joint: No joint effusion. Mild edema within the central aspect of
Hoffa's fat.

Popliteal Fossa:  No Baker cyst. Intact popliteus tendon.

Extensor Mechanism: Intact quadriceps tendon and patellar tendon.
Moderate patellar tendinosis. Intact medial and lateral patellar
retinaculum. Intact MPFL.

Bones: Chronic fragmentation of the tibial tuberosity with marrow
edema in the fragment. Remaining marrow signal is preserved. No
acute fracture or dislocation. No suspicious bone lesion.

Other: Fluid in the deep infrapatellar bursa. 1.8 x 1.3 cm in
ganglion cyst in the superior aspect of the femoral intercondylar
notch.
IMPRESSION: 1. Chronic fragmentation of the tibial tuberosity with evidence of
active inflammation, consistent with Osgood-Schlatter disease.
Moderate patellar tendinosis.
2. Mild deep infrapatellar bursitis.
3. Mild tricompartmental osteoarthritis.
4. 1.8 cm ganglion cyst in the femoral intercondylar notch.

## 2022-11-26 ENCOUNTER — Telehealth: Payer: Self-pay

## 2022-11-26 ENCOUNTER — Other Ambulatory Visit: Payer: Self-pay

## 2022-11-26 ENCOUNTER — Ambulatory Visit: Payer: 59 | Admitting: Family

## 2022-11-26 DIAGNOSIS — G8929 Other chronic pain: Secondary | ICD-10-CM

## 2022-11-26 DIAGNOSIS — M25562 Pain in left knee: Secondary | ICD-10-CM

## 2022-11-26 NOTE — Telephone Encounter (Signed)
Definitely have him come in to see xu.  Looks like he has not seen him since 2019

## 2022-11-26 NOTE — Telephone Encounter (Signed)
Hey this patient seen erin today for knee pain. They are our patient and has had surgery set up before but never followed through they now have cardiac clearance I wanted to know if Xu needed to see them or can we get them scheduled ?

## 2022-12-21 ENCOUNTER — Ambulatory Visit (INDEPENDENT_AMBULATORY_CARE_PROVIDER_SITE_OTHER): Payer: Self-pay | Admitting: Orthopedic Surgery

## 2022-12-21 ENCOUNTER — Ambulatory Visit: Payer: 59 | Admitting: Orthopaedic Surgery

## 2022-12-21 DIAGNOSIS — M25562 Pain in left knee: Secondary | ICD-10-CM

## 2022-12-23 ENCOUNTER — Encounter: Payer: Self-pay | Admitting: Orthopedic Surgery

## 2022-12-23 NOTE — Progress Notes (Signed)
Office Visit Note   Patient: Carl Gray           Date of Birth: [birth year]           MRN: [id number] Visit Date: 12/21/2022              Requested by: Lewis Moccasin, MD [address] [place],  [place] [address] PCP: Lewis Moccasin, MD  Chief Complaint  Patient presents with   Left Knee - Pain      HPI: Patient is a 62 year old gentleman who presents with patella tendinitis symptoms.  Patient states he was scheduled in the past with surgery with Dr. Roda Shutters.  His last examination with Dr. Roda Shutters was consistent with bilateral patella tendinitis.  Assessment & Plan: Visit Diagnoses:  1. Patellar tenderness, left     Plan: Will have patient follow-up with Dr. Shon Baton to see if he could benefit from shockwave therapy on the left.  Follow-Up Instructions: Return if symptoms worsen or fail to improve.   Ortho Exam  Patient is alert, oriented, no adenopathy, well-dressed, normal affect, normal respiratory effort. Examination patient is symptomatic over the origin and insertion of the patellar tendon on the left.  Previous radiograph shows history of Osgood Slaughter with elevation of the tibial tubercle.  He is only tender to palpation at the origin and insertion of the patella tendon.  The medial lateral joint line are nontender to palpation collaterals and cruciates are stable.  He has full active extension with no extensor lag.  Imaging: No results found. No images are attached to the encounter.  Labs: No results found for: "HGBA1C", "ESRSEDRATE", "CRP", "LABURIC", "REPTSTATUS", "GRAMSTAIN", "CULT", "LABORGA"   Lab Results  Component Value Date   ALBUMIN 3.5 04/29/2014    No results found for: "MG" No results found for: "VD25OH"  No results found for: "PREALBUMIN"    Latest Ref Rng & Units 04/29/2014    7:40 PM 12/16/2009   10:48 AM  CBC EXTENDED  WBC 4.0 - 10.5 K/uL 13.6    RBC 4.22 - 5.81 MIL/uL 5.63    Hemoglobin 13.0 - 17.0 g/dL 19.1  47.8   HCT 29.5 -  52.0 % 46.9    Platelets 150 - 400 K/uL 205    NEUT# 1.7 - 7.7 K/uL 10.5    Lymph# 0.7 - 4.0 K/uL 1.6       There is no height or weight on file to calculate BMI.  Orders:  No orders of the defined types were placed in this encounter.  No orders of the defined types were placed in this encounter.    Procedures: No procedures performed  Clinical Data: No additional findings.  ROS:  All other systems negative, except as noted in the HPI. Review of Systems  Objective: Vital Signs: There were no vitals taken for this visit.  Specialty Comments:  No specialty comments available.  PMFS History: There are no problems to display for this patient.  Past Medical History:  Diagnosis Date   Gout    bilateral feet - no meds   LBBB (left bundle branch block)    Never had any problems, stress test done -  neg per patient    History reviewed. No pertinent family history.  History reviewed. No pertinent surgical history. Social History   Occupational History   Not on file  Tobacco Use   Smoking status: Former    Current packs/day: 0.00    Average packs/day: 0.5 packs/day for 15.0 years (7.5 ttl  pk-yrs)    Types: Cigarettes    Start date: 05/17/1981    Quit date: 05/17/1996    Years since quitting: 26.6   Smokeless tobacco: Never  Substance and Sexual Activity   Alcohol use: Yes    Alcohol/week: 4.0 standard drinks of alcohol    Types: 4 Standard drinks or equivalent per week    Comment: Beer/wine/liquor   Drug use: No   Sexual activity: Not on file

## 2022-12-29 ENCOUNTER — Ambulatory Visit: Payer: No Typology Code available for payment source | Admitting: Sports Medicine

## 2022-12-29 ENCOUNTER — Encounter: Payer: Self-pay | Admitting: Sports Medicine

## 2022-12-29 DIAGNOSIS — M7652 Patellar tendinitis, left knee: Secondary | ICD-10-CM | POA: Diagnosis not present

## 2022-12-29 DIAGNOSIS — M7651 Patellar tendinitis, right knee: Secondary | ICD-10-CM

## 2022-12-29 DIAGNOSIS — M76892 Other specified enthesopathies of left lower limb, excluding foot: Secondary | ICD-10-CM

## 2022-12-29 DIAGNOSIS — M76891 Other specified enthesopathies of right lower limb, excluding foot: Secondary | ICD-10-CM

## 2022-12-29 MED ORDER — MELOXICAM 15 MG PO TABS: 15.0000 mg | ORAL_TABLET | Freq: Every day | ORAL | 0 refills | Status: AC

## 2022-12-29 NOTE — Progress Notes (Signed)
Carl Gray - 62 y.o. male MRN [id number]  Date of birth: [birth year]  Office Visit Note: Visit Date: 12/29/2022 PCP: Lewis Moccasin, MD Referred by: Lewis Moccasin, MD  Subjective: Chief Complaint  Patient presents with   Left Knee - Pain   Right Knee - Pain   HPI: Carl Gray is a pleasant 62 y.o. male who presents today for ESWT related to left patellar pain. Carries a diagnosis of bilateral patella tendinitis. 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 Has not had surgery to knees. No PT or injections for knee in past. Using topical NSAIDs in past. Pain interferes with work, he is a Curator for [employer]  Pain is worse with going up and down stairs.   Pertinent ROS were reviewed with the patient and found to be negative unless otherwise specified above in HPI.   Assessment & Plan: Visit Diagnoses:  1. Patellar tendinitis of both knees   2. Enthesopathy of both knees    Plan: Impression is acute on chronic patellar tendinitis with enthesophytes and bony spurring most notable over the tibial tubercle.  Discussed treatment options such as oral medication, shockwave therapy, formalized versus home therapy, PRP or prolo injections.through shared decision-making, proceed with trial of ESWT for patellar tendinitis. Will need 1 week f/u for repeat treatment and evaluation.  He prefers to trial home therapy versus formalized PT, we will plan to give him home exercises at next visit.  For the acute inflammatory process, we will start him on meloxicam 15 mg to be taken once daily for the next 2-3 weeks.  Consider PRP or formal PT if ESWT does not provide benefit after shockwave treatments.  Follow-up: Return in about 1 week (around 01/05/2023) for for bilateral patellar tendinitis (reg visit).   Meds & Orders:  Meds ordered this encounter  Medications   meloxicam (MOBIC) 15 MG tablet    Sig: Take 1 tablet (15 mg total) by mouth daily.     Dispense:  30 tablet    Refill:  0   No orders of the defined types were placed in this encounter.    Procedures: No procedures performed      Clinical History: No specialty comments available.  He reports that he quit smoking about 26 years ago. His smoking use included cigarettes. He started smoking about 41 years ago. He has a 7.5 pack-year smoking history. He has never used smokeless tobacco. No results for input(s): "HGBA1C", "LABURIC" in the last 8760 hours.  Objective:   Vital Signs: There were no vitals taken for this visit.  Physical Exam  Gen: Well-appearing, in no acute distress; non-toxic CV: Well-perfused. Warm.  Resp: Breathing unlabored on room air; no wheezing. Psych: Fluid speech in conversation; appropriate affect; normal thought process Neuro: Sensation intact throughout. No gross coordination deficits.   Ortho Exam Bilateral Knees: Prominent bilateral tibial tuberosities. TTP over proximal patella tendon bilaterally. No laxity in with ligament stress testing. 5/5 LE str. Normal sensation.  Positive TTP over bilateral tibial tubercles, some pain with resisted knee extension left greater than right  Imaging:  *Independent review and interpretation of left knee x-rays from 11/26/2022 was performed today by myself.  There is notable superior and inferior enthesiophytic change of the patella.  There is significant fragmentation of the tibial tubercle, indicative of prior Osgood slaughters disease.  There is minimal patellofemoral and tricompartmental arthritic change.  MR KNEE RIGHT WO CONTRAST CLINICAL DATA:  Right anterior knee pain  for the past 2 months. No injury or prior surgery.  EXAM: MRI OF THE RIGHT KNEE WITHOUT CONTRAST  TECHNIQUE: Multiplanar, multisequence MR imaging of the knee was performed. No intravenous contrast was administered.  COMPARISON:  Right knee x-rays dated August 05, 2017.  FINDINGS: MENISCI  Medial meniscus:   Intact.  Lateral meniscus:  Intact.  LIGAMENTS  Cruciates:  Intact ACL and PCL.  Collaterals: Medial collateral ligament is intact. Lateral collateral ligament complex is intact.  CARTILAGE  Patellofemoral:  Mild thinning partial-thickness cartilage loss.  Medial:  Mild thinning and partial thickness cartilage loss.  Lateral:  Mild thinning and partial-thickness cartilage loss.  Joint: No joint effusion. Mild edema within the central aspect of Hoffa's fat.  Popliteal Fossa:  No Baker cyst. Intact popliteus tendon.  Extensor Mechanism: Intact quadriceps tendon and patellar tendon. Moderate patellar tendinosis. Intact medial and lateral patellar retinaculum. Intact MPFL.  Bones: Chronic fragmentation of the tibial tuberosity with marrow edema in the fragment. Remaining marrow signal is preserved. No acute fracture or dislocation. No suspicious bone lesion.  Other: Fluid in the deep infrapatellar bursa. 1.8 x 1.3 cm in ganglion cyst in the superior aspect of the femoral intercondylar notch.  IMPRESSION: 1. Chronic fragmentation of the tibial tuberosity with evidence of active inflammation, consistent with Osgood-Schlatter disease. Moderate patellar tendinosis. 2. Mild deep infrapatellar bursitis. 3. Mild tricompartmental osteoarthritis. 4. 1.8 cm ganglion cyst in the femoral intercondylar notch.  Electronically Signed   By: Obie Dredge M.D.   On: 01/13/2020 11:32  Past Medical/Family/Surgical/Social History: Medications & Allergies reviewed per EMR, new medications updated. There are no problems to display for this patient.  Past Medical History:  Diagnosis Date   Gout    bilateral feet - no meds   LBBB (left bundle branch block)    Never had any problems, stress test done -  neg per patient   No family history on file. No past surgical history on file. Social History   Occupational History   Not on file  Tobacco Use   Smoking status: Former     Current packs/day: 0.00    Average packs/day: 0.5 packs/day for 15.0 years (7.5 ttl pk-yrs)    Types: Cigarettes    Start date: 05/17/1981    Quit date: 05/17/1996    Years since quitting: 26.6   Smokeless tobacco: Never  Substance and Sexual Activity   Alcohol use: Yes    Alcohol/week: 4.0 standard drinks of alcohol    Types: 4 Standard drinks or equivalent per week    Comment: Beer/wine/liquor   Drug use: No   Sexual activity: Not on file   Medical Resident - Attending Physician Addendum:   I have independently interviewed and examined the patient myself. I have discussed the above with the original author and agree with their documentation. My edits for correction/addition/clarification have been made, see any changes above and below.   -Saw Dr. Roda Shutters for this in past, discussed surgical options but held off at that time.  Recently saw Dr. Lajoyce Corners and they think he would be a candidate for extracorporeal shockwave and other conservative treatment. Did perform ECSWT today. F/u 1 week. See remainder of A/P as above.  Madelyn Brunner, DO Primary Care Sports Medicine Physician  [facility] [place] [facility]

## 2022-12-29 NOTE — Progress Notes (Signed)
c 

## 2023-01-05 ENCOUNTER — Encounter: Payer: Self-pay | Admitting: Sports Medicine

## 2023-01-05 ENCOUNTER — Ambulatory Visit: Payer: No Typology Code available for payment source | Admitting: Sports Medicine

## 2023-01-05 DIAGNOSIS — M7652 Patellar tendinitis, left knee: Secondary | ICD-10-CM

## 2023-01-05 DIAGNOSIS — M76892 Other specified enthesopathies of left lower limb, excluding foot: Secondary | ICD-10-CM | POA: Diagnosis not present

## 2023-01-05 DIAGNOSIS — M76891 Other specified enthesopathies of right lower limb, excluding foot: Secondary | ICD-10-CM | POA: Diagnosis not present

## 2023-01-05 DIAGNOSIS — M7651 Patellar tendinitis, right knee: Secondary | ICD-10-CM

## 2023-01-05 NOTE — Progress Notes (Signed)
Carl Gray - 62 y.o. male MRN [id number]  Date of birth: [birth year]  Office Visit Note: Visit Date: 01/05/2023 PCP: Lewis Moccasin, MD Referred by: Lewis Moccasin, MD  Subjective: Chief Complaint  Patient presents with   Right Knee - Follow-up   Left Knee - Follow-up   HPI: Carl Gray is a pleasant 62 y.o. male who presents today for follow-up of bilateral patellar tendinopathy.  Last visit we did proceed with a first trial of extracorporeal shockwave therapy, he did notice a positive response to this.  He does like he is at least 30% improved. Has noticed a difference with walking, still pain with stairs.  He is also using meloxicam 15 mg for the first 4 days and then did discontinue this as was feeling better.  Pertinent ROS were reviewed with the patient and found to be negative unless otherwise specified above in HPI.   Assessment & Plan: Visit Diagnoses:  1. Patellar tendinitis of both knees   2. Enthesopathy of both knees    Plan: Carl Gray had a positive response to her first treatment of extracorporeal shockwave therapy for his bilateral patellar tendinitis.  We did repeat this treatment today, he tolerated well.  He prefers home therapy versus formalized physical therapy, I did print out a customized handout for both of his knees to work on strengthening and stabilization.  We did review these in the room today.  He will perform these once daily.  Given that his pain is improving, he may transition to meloxicam only as needed instead of scheduled.  He will follow-up after his return from vacation and we will consider repeat shockwave and reevaluation.  Follow-up: Return in about 2 weeks (around 01/19/2023) for for b/l knees.   Meds & Orders: No orders of the defined types were placed in this encounter.  No orders of the defined types were placed in this encounter.    Procedures: Procedure: ECSWT Indications:  Patellar tendindinitis   Procedure  Details Consent: Risks of procedure as well as the alternatives and risks of each were explained to the patient.  Verbal consent for procedure obtained. Time Out: Verified patient identification, verified procedure, site was marked, verified correct patient position. The area was cleaned with alcohol swab.     The bilateral patellar tendons were targeted for Extracorporeal shockwave therapy.    Preset: Patellar tendinitis/tendinopathy Power Level: 100 mJ Frequency: 10 Hz Impulse/cycles: 2200 (200 at TT) Head size: Regular     Patient tolerated procedure well without immediate complications.         Clinical History: No specialty comments available.  He reports that he quit smoking about 26 years ago. His smoking use included cigarettes. He started smoking about 41 years ago. He has a 7.5 pack-year smoking history. He has never used smokeless tobacco. No results for input(s): "HGBA1C", "LABURIC" in the last 8760 hours.  Objective:    Physical Exam  Gen: Well-appearing, in no acute distress; non-toxic CV:  Well-perfused. Warm.  Resp: Breathing unlabored on room air; no wheezing. Psych: Fluid speech in conversation; appropriate affect; normal thought process Neuro: Sensation intact throughout. No gross coordination deficits.   Ortho Exam - Bilateral knees: Prominent bilateral tibial tuberosity with bony bossing.  Full range of motion.  5/5 strength about the knees.  Imaging: No results found.  Past Medical/Family/Surgical/Social History: Medications & Allergies reviewed per EMR, new medications updated. There are no problems to display for this patient.  Past Medical History:  Diagnosis Date  Gout    bilateral feet - no meds   LBBB (left bundle branch block)    Never had any problems, stress test done -  neg per patient   History reviewed. No pertinent family history. History reviewed. No pertinent surgical history. Social History   Occupational History   Not on  file  Tobacco Use   Smoking status: Former    Current packs/day: 0.00    Average packs/day: 0.5 packs/day for 15.0 years (7.5 ttl pk-yrs)    Types: Cigarettes    Start date: 05/17/1981    Quit date: 05/17/1996    Years since quitting: 26.6   Smokeless tobacco: Never  Substance and Sexual Activity   Alcohol use: Yes    Alcohol/week: 4.0 standard drinks of alcohol    Types: 4 Standard drinks or equivalent per week    Comment: Beer/wine/liquor   Drug use: No   Sexual activity: Not on file   Patient was instructed in 10 minutes of therapeutic exercises for bilateral knees/patellar tendons to improve strength, ROM and function according to my instructions and plan of care during the office visit. A customized handout was provided and demonstration of proper technique shown and discussed. Patient did perform exercises and demonstrate understanding through teachback.  All questions discussed and answered.  Madelyn Brunner, DO Primary Care Sports Medicine Physician  [facility]  This note was dictated using Dragon naturally speaking software and may contain errors in syntax, spelling, or content which have not been identified prior to signing this note.

## 2023-01-05 NOTE — Progress Notes (Signed)
1st round of shock wave did help -30% improvement

## 2023-01-25 ENCOUNTER — Other Ambulatory Visit: Payer: Self-pay | Admitting: Sports Medicine

## 2023-01-28 ENCOUNTER — Encounter: Payer: Self-pay | Admitting: Sports Medicine

## 2023-01-28 ENCOUNTER — Ambulatory Visit (INDEPENDENT_AMBULATORY_CARE_PROVIDER_SITE_OTHER): Payer: No Typology Code available for payment source | Admitting: Sports Medicine

## 2023-01-28 DIAGNOSIS — M76891 Other specified enthesopathies of right lower limb, excluding foot: Secondary | ICD-10-CM

## 2023-01-28 DIAGNOSIS — M7652 Patellar tendinitis, left knee: Secondary | ICD-10-CM

## 2023-01-28 DIAGNOSIS — M7651 Patellar tendinitis, right knee: Secondary | ICD-10-CM

## 2023-01-28 DIAGNOSIS — M76892 Other specified enthesopathies of left lower limb, excluding foot: Secondary | ICD-10-CM | POA: Diagnosis not present

## 2023-01-28 NOTE — Progress Notes (Signed)
[name] - 62 y.o. male MRN [id number]  Date of birth: [birth year]  Office Visit Note: Visit Date: 01/28/2023 PCP: Lewis Moccasin, MD Referred by: Lewis Moccasin, MD  Subjective: Chief Complaint  Patient presents with   Right Knee - Pain, Follow-up   Left Knee - Follow-up, Pain   HPI: Carl Gray is a pleasant 62 y.o. male who presents today for bilateral patellar tendinopathy.  We have performed 2 sessions of extracorporeal shockwave therapy and has had a good response to this.  Previously took meloxicam 15 mg but did discontinue this. 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 Overall pain is better, but would like to proceed with additional treatments.  Pertinent ROS were reviewed with the patient and found to be negative unless otherwise specified above in HPI.   Assessment & Plan: Visit Diagnoses:  1. Patellar tendinitis of both knees   2. Enthesopathy of both knees    Plan: [name] has received positive response to his first 2 treatments of extracorporeal shockwave therapy.  Recently had a mild exacerbation given his recent travel, being on his knees painting and umpiring.  We proceeded with an additional ESWT today, patient tolerated well.  Would like him to get back into his home strengthening and stabilization exercises.  May use ice after activity.  He will follow-up in 2 weeks for repeat shockwave and evaluation.  Okay for over-the-counter anti-inflammatories as needed.  Follow-up: Return in about 2 weeks (around 02/11/2023) for bilateral knees.   Meds & Orders: No orders of the defined types were placed in this encounter.  No orders of the defined types were placed in this encounter.    Procedures: Procedure: ECSWT Indications:  Patellar tendindinitis   Procedure Details Consent: Risks of procedure as well as the alternatives and risks of each were explained to the patient.  Verbal consent for procedure obtained. Time Out: Verified  patient identification, verified procedure, site was marked, verified correct patient position. The area was cleaned with alcohol swab.     The bilateral patellar tendons were targeted for Extracorporeal shockwave therapy.    Preset: Patellar tendinitis/tendinopathy Power Level: 100 mJ Frequency: 11 Hz Impulse/cycles: 2250 (250 at TT) Head size: Regular   Patient tolerated procedure well without immediate complications.      Clinical History: No specialty comments available.  He reports that he quit smoking about 26 years ago. His smoking use included cigarettes. He started smoking about 41 years ago. He has a 7.5 pack-year smoking history. He has never used smokeless tobacco. No results for input(s): "HGBA1C", "LABURIC" in the last 8760 hours.  Objective:    Physical Exam  Gen: Well-appearing, in no acute distress; non-toxic CV: Well-perfused. Warm.  Resp: Breathing unlabored on room air; no wheezing. Psych: Fluid speech in conversation; appropriate affect; normal thought process Neuro: Sensation intact throughout. No gross coordination deficits.   Ortho Exam - Bilateral knees: Prominent bilateral tibial tuberosity bossing, mildly tender to palpation.  Full range of motion about the knees.  5/5 strength bilateral knees.  Imaging: No results found.  Past Medical/Family/Surgical/Social History: Medications & Allergies reviewed per EMR, new medications updated. There are no problems to display for this patient.  Past Medical History:  Diagnosis Date   Gout    bilateral feet - no meds   LBBB (left bundle branch block)    Never had any problems, stress test done -  neg per patient   History reviewed. No pertinent family history. History reviewed. No  pertinent surgical history. Social History   Occupational History   Not on file  Tobacco Use   Smoking status: Former    Current packs/day: 0.00    Average packs/day: 0.5 packs/day for 15.0 years (7.5 ttl pk-yrs)    Types:  Cigarettes    Start date: 05/17/1981    Quit date: 05/17/1996    Years since quitting: 26.7   Smokeless tobacco: Never  Substance and Sexual Activity   Alcohol use: Yes    Alcohol/week: 4.0 standard drinks of alcohol    Types: 4 Standard drinks or equivalent per week    Comment: Beer/wine/liquor   Drug use: No   Sexual activity: Not on file

## 2023-01-28 NOTE — Progress Notes (Signed)
Patient says that he feels his knees are getting better but still need work. He is not taking any medication for pain.

## 2023-02-10 ENCOUNTER — Ambulatory Visit (INDEPENDENT_AMBULATORY_CARE_PROVIDER_SITE_OTHER): Payer: No Typology Code available for payment source | Admitting: Sports Medicine

## 2023-02-10 DIAGNOSIS — M76892 Other specified enthesopathies of left lower limb, excluding foot: Secondary | ICD-10-CM

## 2023-02-10 DIAGNOSIS — M76891 Other specified enthesopathies of right lower limb, excluding foot: Secondary | ICD-10-CM

## 2023-02-10 DIAGNOSIS — M7651 Patellar tendinitis, right knee: Secondary | ICD-10-CM

## 2023-02-10 DIAGNOSIS — M7652 Patellar tendinitis, left knee: Secondary | ICD-10-CM | POA: Diagnosis not present

## 2023-02-10 NOTE — Progress Notes (Signed)
Patient states that he is feeling good. He says that at rest he feels no pain, but that stairs and hills will give him pain in his knees. He does say that this pain has gotten better since starting the shockwave treatments.

## 2023-02-18 ENCOUNTER — Ambulatory Visit (INDEPENDENT_AMBULATORY_CARE_PROVIDER_SITE_OTHER): Payer: No Typology Code available for payment source | Admitting: Sports Medicine

## 2023-02-18 ENCOUNTER — Encounter: Payer: Self-pay | Admitting: Sports Medicine

## 2023-02-18 DIAGNOSIS — M7651 Patellar tendinitis, right knee: Secondary | ICD-10-CM

## 2023-02-18 DIAGNOSIS — M76891 Other specified enthesopathies of right lower limb, excluding foot: Secondary | ICD-10-CM

## 2023-02-18 DIAGNOSIS — M7652 Patellar tendinitis, left knee: Secondary | ICD-10-CM | POA: Diagnosis not present

## 2023-02-18 DIAGNOSIS — M76892 Other specified enthesopathies of left lower limb, excluding foot: Secondary | ICD-10-CM

## 2023-02-18 NOTE — Progress Notes (Signed)
Patient states that he "is always getting a little better." He says that he still has pain going down stairs and hills, but going up feels okay. He says that he finds himself using the hand rail or taking the easier route when going down stairs and hills.

## 2023-02-18 NOTE — Progress Notes (Signed)
Carl Gray - 62 y.o. male MRN [id number]  Date of birth: [birth year]  Office Visit Note: Visit Date: 02/18/2023 PCP: Lewis Moccasin, MD Referred by: Lewis Moccasin, MD  Subjective: Chief Complaint  Patient presents with   Right Knee - Follow-up   Left Knee - Follow-up   HPI: Carl Gray is a pleasant 62 y.o. male who presents today for bilateral knee pain with patellar tendinopathy.   We have performed 4 sessions of extracorporeal shockwave therapy and he continues to note a positive response and reduction in his pain with each treatment as he feels it gets better with each treatment.  Still has some pain going up and down stairs/hills but in general improving.  Continues to be active with activity.   He is asking about next steps, he previously saw an orthopedic physician years ago he was going to offer him surgery.  Sounds like they were describing osteophyte excision.   Pertinent ROS were reviewed with the patient and found to be negative unless otherwise specified above in HPI.   Assessment & Plan: Visit Diagnoses:  1. Patellar tendinitis of both knees   2. Enthesopathy of both knees    Plan: Trentan is making good progress (overall 80-90% improved) with a few treatments of extracorporeal shockwave therapy.  He does have patellar tendinitis with chronic fragmentation of the tibial tubercles from prior [employer] disease.  His daily pain is minimal at this point but does worsen with repetitive bending and going up and down steps/hills.  We did perform our last session of ESWT today, and at this point would like to take a 6-week holiday to see what further cumulative benefit he receives from this.  He did state in the past he saw a surgeon years ago who talked about doing surgery for his condition, it sounds like he is describing osteophyte excision.  I told him this was not commonly performed for his condition (this is what Dr. Lajoyce Corners discussed with him as well), but  he would like to know all treatment options which I feel is reasonable.  Given this, I would like him to see my partner Dr. Steward Drone to discuss any surgical options.  From my standpoint, we discussed other treatment options would include PRP or prolotherapy for the distal patellar tendon.  He will follow-up with Dr. Steward Drone and then notify me in about 6 weeks how he is doing to discuss next steps.  Continue his home rehab and use over-the-counter anti-inflammatories as needed.  Follow-up: Return in about 2 weeks (around 03/04/2023) for make appt with Dr. Steward Drone for bilateral knees.   Meds & Orders: No orders of the defined types were placed in this encounter.  No orders of the defined types were placed in this encounter.    Procedures: Procedure: ECSWT Indications:  Patellar tendindinitis   Procedure Details Consent: Risks of procedure as well as the alternatives and risks of each were explained to the patient.  Verbal consent for procedure obtained. Time Out: Verified patient identification, verified procedure, site was marked, verified correct patient position. The area was cleaned with alcohol swab.     The bilateral patellar tendons were targeted for Extracorporeal shockwave therapy.    Preset: Patellar tendinitis/tendinopathy Power Level: 100 mJ  Frequency: 11 Hz Impulse/cycles: 2250 (250 at TT) Head size: Regular   Patient tolerated procedure well without immediate complications.        Clinical History: No specialty comments available.  He reports that he quit smoking about  26 years ago. His smoking use included cigarettes. He started smoking about 41 years ago. He has a 7.5 pack-year smoking history. He has never used smokeless tobacco. No results for input(s): "HGBA1C", "LABURIC" in the last 8760 hours.  Objective:    Physical Exam  Gen: Well-appearing, in no acute distress; non-toxic CV: Well-perfused. Warm.  Resp: Breathing unlabored on room air; no wheezing. Psych:  Fluid speech in conversation; appropriate affect; normal thought process Neuro: Sensation intact throughout. No gross coordination deficits.   Ortho Exam - Bilateral knees: There is prominent tibial tuberosity bossing with mild TTP of tibial tubercle left greater than right.  There is full range of motion about the knees.  5/5 strength of bilateral knees.  Imaging:  MR KNEE RIGHT WO CONTRAST CLINICAL DATA:  Right anterior knee pain for the past 2 months. No injury or prior surgery.  EXAM: MRI OF THE RIGHT KNEE WITHOUT CONTRAST  TECHNIQUE: Multiplanar, multisequence MR imaging of the knee was performed. No intravenous contrast was administered.  COMPARISON:  Right knee x-rays dated August 05, 2017.  FINDINGS: MENISCI  Medial meniscus:  Intact.  Lateral meniscus:  Intact.  LIGAMENTS  Cruciates:  Intact ACL and PCL.  Collaterals: Medial collateral ligament is intact. Lateral collateral ligament complex is intact.  CARTILAGE  Patellofemoral:  Mild thinning partial-thickness cartilage loss.  Medial:  Mild thinning and partial thickness cartilage loss.  Lateral:  Mild thinning and partial-thickness cartilage loss.  Joint: No joint effusion. Mild edema within the central aspect of Hoffa's fat.  Popliteal Fossa:  No Baker cyst. Intact popliteus tendon.  Extensor Mechanism: Intact quadriceps tendon and patellar tendon. Moderate patellar tendinosis. Intact medial and lateral patellar retinaculum. Intact MPFL.  Bones: Chronic fragmentation of the tibial tuberosity with marrow edema in the fragment. Remaining marrow signal is preserved. No acute fracture or dislocation. No suspicious bone lesion.  Other: Fluid in the deep infrapatellar bursa. 1.8 x 1.3 cm in ganglion cyst in the superior aspect of the femoral intercondylar notch.  IMPRESSION: 1. Chronic fragmentation of the tibial tuberosity with evidence of active inflammation, consistent with Osgood-Schlatter  disease. Moderate patellar tendinosis. 2. Mild deep infrapatellar bursitis. 3. Mild tricompartmental osteoarthritis. 4. 1.8 cm ganglion cyst in the femoral intercondylar notch.  Electronically Signed   By: Obie Dredge M.D.   On: 01/13/2020 11:32    Past Medical/Family/Surgical/Social History: Medications & Allergies reviewed per EMR, new medications updated. There are no problems to display for this patient.  Past Medical History:  Diagnosis Date   Gout    bilateral feet - no meds   LBBB (left bundle branch block)    Never had any problems, stress test done -  neg per patient   No family history on file. No past surgical history on file. Social History   Occupational History   Not on file  Tobacco Use   Smoking status: Former    Current packs/day: 0.00    Average packs/day: 0.5 packs/day for 15.0 years (7.5 ttl pk-yrs)    Types: Cigarettes    Start date: 05/17/1981    Quit date: 05/17/1996    Years since quitting: 26.7   Smokeless tobacco: Never  Substance and Sexual Activity   Alcohol use: Yes    Alcohol/week: 4.0 standard drinks of alcohol    Types: 4 Standard drinks or equivalent per week    Comment: Beer/wine/liquor   Drug use: No   Sexual activity: Not on file

## 2023-03-02 ENCOUNTER — Ambulatory Visit (HOSPITAL_BASED_OUTPATIENT_CLINIC_OR_DEPARTMENT_OTHER): Payer: 59 | Admitting: Orthopaedic Surgery
